# Patient Record
Sex: Female | Born: 1940 | Race: White | Hispanic: No | Marital: Single | Smoking: Former smoker
Health system: Southern US, Community
[De-identification: ages and names within clinical notes are randomized; demographics above are authoritative.]

## PROBLEM LIST (undated history)

## (undated) DIAGNOSIS — M1990 Unspecified osteoarthritis, unspecified site: Secondary | ICD-10-CM

## (undated) DIAGNOSIS — E162 Hypoglycemia, unspecified: Secondary | ICD-10-CM

## (undated) DIAGNOSIS — I959 Hypotension, unspecified: Secondary | ICD-10-CM

## (undated) HISTORY — DX: Hypotension, unspecified: I95.9

## (undated) HISTORY — PX: HEMORRHOID SURGERY: SHX153

## (undated) HISTORY — PX: OTHER SURGICAL HISTORY: SHX169

## (undated) HISTORY — DX: Unspecified osteoarthritis, unspecified site: M19.90

## (undated) HISTORY — DX: Hypoglycemia, unspecified: E16.2

---

## 2020-05-04 ENCOUNTER — Other Ambulatory Visit: Payer: Self-pay

## 2020-05-04 ENCOUNTER — Encounter (HOSPITAL_BASED_OUTPATIENT_CLINIC_OR_DEPARTMENT_OTHER): Payer: Self-pay

## 2020-05-04 ENCOUNTER — Emergency Department (HOSPITAL_BASED_OUTPATIENT_CLINIC_OR_DEPARTMENT_OTHER): Payer: Medicare Other

## 2020-05-04 ENCOUNTER — Emergency Department (HOSPITAL_BASED_OUTPATIENT_CLINIC_OR_DEPARTMENT_OTHER)
Admission: EM | Admit: 2020-05-04 | Discharge: 2020-05-04 | Disposition: A | Discharge status: Home / Self Care | Payer: Medicare Other | Attending: Emergency Medicine | Admitting: Emergency Medicine

## 2020-05-04 DIAGNOSIS — Y9289 Other specified places as the place of occurrence of the external cause: Secondary | ICD-10-CM | POA: Insufficient documentation

## 2020-05-04 DIAGNOSIS — W19XXXA Unspecified fall, initial encounter: Secondary | ICD-10-CM | POA: Diagnosis not present

## 2020-05-04 DIAGNOSIS — S8001XA Contusion of right knee, initial encounter: Secondary | ICD-10-CM | POA: Diagnosis not present

## 2020-05-04 DIAGNOSIS — Z87891 Personal history of nicotine dependence: Secondary | ICD-10-CM | POA: Diagnosis not present

## 2020-05-04 DIAGNOSIS — S0993XA Unspecified injury of face, initial encounter: Secondary | ICD-10-CM | POA: Diagnosis present

## 2020-05-04 DIAGNOSIS — Y9389 Activity, other specified: Secondary | ICD-10-CM | POA: Insufficient documentation

## 2020-05-04 DIAGNOSIS — Y999 Unspecified external cause status: Secondary | ICD-10-CM | POA: Diagnosis not present

## 2020-05-04 DIAGNOSIS — S5001XA Contusion of right elbow, initial encounter: Secondary | ICD-10-CM | POA: Insufficient documentation

## 2020-05-04 DIAGNOSIS — S0083XA Contusion of other part of head, initial encounter: Secondary | ICD-10-CM | POA: Insufficient documentation

## 2020-05-04 DIAGNOSIS — R55 Syncope and collapse: Secondary | ICD-10-CM

## 2020-05-04 DIAGNOSIS — G939 Disorder of brain, unspecified: Secondary | ICD-10-CM | POA: Insufficient documentation

## 2020-05-04 LAB — BASIC METABOLIC PANEL
Anion gap: 10 (ref 5–15)
BUN: 14 mg/dL (ref 8–23)
CO2: 24 mmol/L (ref 22–32)
Calcium: 9.7 mg/dL (ref 8.9–10.3)
Chloride: 103 mmol/L (ref 98–111)
Creatinine, Ser: 0.82 mg/dL (ref 0.44–1.00)
GFR calc Af Amer: >60 mL/min (ref >60)
GFR calc non Af Amer: >60 mL/min (ref >60)
Glucose, Bld: 139 mg/dL — ABNORMAL HIGH (ref 70–99)
Potassium: 3.6 mmol/L (ref 3.5–5.1)
Sodium: 137 mmol/L (ref 135–145)

## 2020-05-04 LAB — CBC
HCT: 40.2 % (ref 36.0–46.0)
Hemoglobin: 12.7 g/dL (ref 12.0–15.0)
MCH: 28.7 pg (ref 26.0–34.0)
MCHC: 31.6 g/dL (ref 30.0–36.0)
MCV: 90.7 fL (ref 80.0–100.0)
Platelets: 293 10*3/uL (ref 150–400)
RBC: 4.43 MIL/uL (ref 3.87–5.11)
RDW: 13.4 % (ref 11.5–15.5)
WBC: 9.8 10*3/uL (ref 4.0–10.5)
nRBC: 0 % (ref 0.0–0.2)

## 2020-05-04 LAB — CBG MONITORING, ED: Glucose-Capillary: 132 mg/dL — ABNORMAL HIGH (ref 70–99)

## 2020-05-04 MED ORDER — SODIUM CHLORIDE 0.9% FLUSH
3.0000 mL | Freq: Once | INTRAVENOUS | Status: DC
  Filled 2020-05-04: qty 3

## 2020-05-04 NOTE — Discharge Instructions (Addendum)
You were seen in the emergency department for evaluation of injuries after a fainting spell at your house yesterday.  You had a CAT scan of your head face along with x-rays of your right elbow and knee that did not show any serious findings.  The CAT scan of your head did not show a lesion that was noted in 2010 that was apparently larger now.  This will need follow-up with your doctors and likely an outpatient MRI.  Please return to the emergency department for any worsening or concerning symptoms.  You can use ice to the affected areas and Tylenol for pain.  Included below is the report of the CAT scan.  IMPRESSION:  1. No acute intracranial injury.  2. Hypodensity right temporal lobe has progressed since 2010. 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 Recommend elective  follow-up MRI of the brain without with contrast  3. Negative for facial fracture.

## 2020-05-04 NOTE — ED Notes (Signed)
Pt unable to urinate at this time.  

## 2020-05-04 NOTE — ED Triage Notes (Addendum)
Pt states she was reaching to turn off fan yesterday at 616am and passed out "woke up on the floor"-states she was out ~73min or less-pain to mouth,face-rigth UE and right LE-NAD-steady gait-pt states she was advised to go to walk in clinic yesterday-she did not seek medical attention

## 2020-05-04 NOTE — ED Provider Notes (Signed)
[facility] EMERGENCY DEPARTMENT Provider Note   CSN: [id number] Arrival date & time: 05/04/20  1130     History Chief Complaint  Patient presents with  . Loss of Consciousness    Barbara Wise is a 79 y.o. female.  She has a history of arthritis.  She said she woke up yesterday morning and reached over to adjust a fan and passed out striking her face right arm right leg on the ground.  It took her a while to be able to get up off the floor.  She thinks she was unconscious for about a minute.  Continues to have some elbow and knee pain.  Facial pain.  No blurry vision double vision.  No malocclusion.  No headache.  No numbness or weakness.  Not on blood thinners.  The history is provided by the patient.  Loss of Consciousness Episode history:  Single Most recent episode:  Yesterday Duration:  1 minute Progression:  Resolved Chronicity:  New Context: standing up   Witnessed: no   Relieved by:  Nothing Worsened by:  Nothing Ineffective treatments:  None tried Associated symptoms: no chest pain, no difficulty breathing, no fever, no focal weakness, no headaches, no nausea, no rectal bleeding, no shortness of breath, no visual change and no vomiting        Past Medical History:  Diagnosis Date  . Arthritis   . Hypotension   . Low blood sugar     There are no problems to display for this patient.   Past Surgical History:  Procedure Laterality Date  . hand surgey    . HEMORRHOID SURGERY       OB History   No obstetric history on file.     No family history on file.  Social History   Tobacco Use  . Smoking status: Former Games developer  . Smokeless tobacco: Never Used  Vaping Use  . Vaping Use: Never used  Substance Use Topics  . Alcohol use: Never  . Drug use: Never    Home Medications Prior to Admission medications   Not on File    Allergies    Sulfa antibiotics  Review of Systems   Review of Systems  Constitutional: Negative for fever.    HENT: Negative for sore throat.   Eyes: Negative for visual disturbance.  Respiratory: Negative for shortness of breath.   Cardiovascular: Positive for syncope. Negative for chest pain.  Gastrointestinal: Negative for abdominal pain, nausea and vomiting.  Genitourinary: Negative for dysuria.  Musculoskeletal: Negative for neck pain.  Skin: Positive for wound. Negative for rash.  Neurological: Positive for syncope. Negative for focal weakness and headaches.    Physical Exam Updated Vital Signs BP 123/73 (BP Location: Left Arm)   Pulse 71   Temp 99 F (37.2 C) (Oral)   Resp 18   Ht 5\' 3"  (1.6 m)   Wt 63 kg   SpO2 95%   BMI 24.62 kg/m   Physical Exam Vitals and nursing note reviewed.  Constitutional:      General: She is not in acute distress.    Appearance: She is well-developed.  HENT:     Head: Normocephalic.     Comments: Diffuse tenderness around her right orbit and zygoma.  Little bit of soreness of her mandible.  Swelling of lower lip. Eyes:     Conjunctiva/sclera: Conjunctivae normal.  Cardiovascular:     Rate and Rhythm: Normal rate and regular rhythm.     Pulses: Normal pulses.  Heart sounds: No murmur heard.   Pulmonary:     Effort: Pulmonary effort is normal. No respiratory distress.     Breath sounds: Normal breath sounds.  Abdominal:     Palpations: Abdomen is soft.     Tenderness: There is no abdominal tenderness.  Musculoskeletal:        General: Tenderness and signs of injury present. Normal range of motion.     Cervical back: Neck supple.     Comments: She has some bruising and abrasions over her right elbow.  Full range of motion.  She is also got some bruising and abrasions over her right knee.  Full range of motion no gross effusions.  Distal neurovascular intact.  Other extremities full range of motion without any pain or limitations.  No cervical thoracic or lumbar tenderness.  Skin:    General: Skin is warm and dry.     Capillary Refill:  Capillary refill takes less than 2 seconds.  Neurological:     General: No focal deficit present.     Mental Status: She is alert and oriented to person, place, and time.     Sensory: No sensory deficit.     Motor: No weakness.     ED Results / Procedures / Treatments   Labs (all labs ordered are listed, but only abnormal results are displayed) Labs Reviewed  BASIC METABOLIC PANEL - Abnormal; Notable for the following components:      Result Value   Glucose, Bld 139 (*)    All other components within normal limits  CBG MONITORING, ED - Abnormal; Notable for the following components:   Glucose-Capillary 132 (*)    All other components within normal limits  CBC  URINALYSIS, ROUTINE W REFLEX MICROSCOPIC    EKG EKG Interpretation  Date/Time:  Wednesday May 04 2020 11:54:33 EDT Ventricular Rate:  71 PR Interval:  128 QRS Duration: 78 QT Interval:  356 QTC Calculation: 386 R Axis:   44 Text Interpretation: Normal sinus rhythm Normal ECG No old tracing to compare Confirmed by Meridee ScoreButler, Janeah Kovacich [id number]) on 05/04/2020 12:06:56 PM   Radiology DG Elbow Complete Right  Result Date: 05/04/2020 CLINICAL DATA:  Right elbow pain after fall today. EXAM: RIGHT ELBOW - COMPLETE 3+ VIEW COMPARISON:  None. FINDINGS: There is no evidence of fracture, dislocation, or joint effusion. There is no evidence of arthropathy or other focal bone abnormality. Soft tissues are unremarkable. IMPRESSION: Negative. Electronically Signed   By: Lupita RaiderJames  Green Jr M.D.   On: 05/04/2020 13:55   CT Head Wo Contrast  Result Date: 05/04/2020 CLINICAL DATA:  Head trauma.  Fall EXAM: CT HEAD WITHOUT CONTRAST CT MAXILLOFACIAL WITHOUT CONTRAST TECHNIQUE: Multidetector CT imaging of the head and maxillofacial structures were performed using the standard protocol without intravenous contrast. Multiplanar CT image reconstructions of the maxillofacial structures were also generated. COMPARISON:  MRI head 11/20/2008. FINDINGS: CT  HEAD FINDINGS Brain: Mild ventricular enlargement consistent with atrophy. Hypodensity in the right temporal lobe lateral to the temporal horn measuring approximately 15 by 25 mm. This lesion has progressed since the prior MRI. No significant mass-effect. No associated hemorrhage. Negative for acute infarct, hemorrhage, mass. Vascular: Negative for hyperdense vessel Skull: Negative for fracture. Benign bony exostosis in the right temporal bone. Other: None CT MAXILLOFACIAL FINDINGS Osseous: Negative for facial fracture. Orbits: No orbital mass or edema.  Bilateral cataract extraction Sinuses: Paranasal sinuses clear.  Mastoid clear. Soft tissues: Negative for soft tissue swelling or mass. IMPRESSION: 1. No acute intracranial  injury. 2. Hypodensity right temporal lobe has progressed since 2010. 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 Recommend elective follow-up MRI of the brain without with contrast 3. Negative for facial fracture. Electronically Signed   By: Marlan Palau M.D.   On: 05/04/2020 13:54   DG Knee Complete 4 Views Right  Result Date: 05/04/2020 CLINICAL DATA:  Right knee pain after fall today. EXAM: RIGHT KNEE - COMPLETE 4+ VIEW COMPARISON:  None. FINDINGS: No evidence of fracture, dislocation, or joint effusion. No evidence of arthropathy or other focal bone abnormality. Soft tissues are unremarkable. IMPRESSION: Negative. Electronically Signed   By: Lupita Raider M.D.   On: 05/04/2020 13:56   CT Maxillofacial WO CM  Result Date: 05/04/2020 CLINICAL DATA:  Head trauma.  Fall EXAM: CT HEAD WITHOUT CONTRAST CT MAXILLOFACIAL WITHOUT CONTRAST TECHNIQUE: Multidetector CT imaging of the head and maxillofacial structures were performed using the standard protocol without intravenous contrast. Multiplanar CT image reconstructions of the maxillofacial structures were also generated. COMPARISON:  MRI head 11/20/2008. FINDINGS: CT HEAD FINDINGS Brain: Mild ventricular enlargement consistent  with atrophy. Hypodensity in the right temporal lobe lateral to the temporal horn measuring approximately 15 by 25 mm. This lesion has progressed since the prior MRI. No significant mass-effect. No associated hemorrhage. Negative for acute infarct, hemorrhage, mass. Vascular: Negative for hyperdense vessel Skull: Negative for fracture. Benign bony exostosis in the right temporal bone. Other: None CT MAXILLOFACIAL FINDINGS Osseous: Negative for facial fracture. Orbits: No orbital mass or edema.  Bilateral cataract extraction Sinuses: Paranasal sinuses clear.  Mastoid clear. Soft tissues: Negative for soft tissue swelling or mass. IMPRESSION: 1. No acute intracranial injury. 2. Hypodensity right temporal lobe has progressed since 2010. 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 Recommend elective follow-up MRI of the brain without with contrast 3. Negative for facial fracture. Electronically Signed   By: Marlan Palau M.D.   On: 05/04/2020 13:54    Procedures Procedures (including critical care time)  Medications Ordered in ED Medications  sodium chloride flush (NS) 0.9 % injection 3 mL (has no administration in time range)    ED Course  I have reviewed the triage vital signs and the nursing notes.  Pertinent labs & imaging results that were available during my care of the patient were reviewed by me and considered in my medical decision making (see chart for details).  Clinical Course as of May 05 1923  Wed May 04, 2020  1424 Patient is aware of the lesion that was noted in the past.  She is not excited about the prospect of getting an MRI.  I told her she does not need it as an emergency but would have her follow-up with her neurologist and primary care doctor regarding this.   [MB]    Clinical Course User Index [MB] Terrilee Files, MD   MDM Rules/Calculators/A&P                         This patient complains of syncope, facial pain, right elbow and right knee pain; this involves  an extensive number of treatment Options and is a complaint that carries with it a high risk of complications and Morbidity. The differential includes arrhythmia, anemia, metabolic derangement, head bleed, facial fractures, dislocation, fracture, contusion  I ordered, reviewed and interpreted labs, which included CBC with normal white count normal hemoglobin, chemistries normal except for mildly elevated glucose  I ordered imaging studies which included CT head and  maxface, x-rays of right elbow and right knee and I independently    visualized and interpreted imaging which showed no acute findings.  Radiology does comment upon a hypodensity that will need follow-up Previous records obtained and reviewed in epic  After the interventions stated above, I reevaluated the patient and found patient's vitals and symptoms to be stable.  Reviewed imaging findings with patient and recommended follow-up with her PCP.  Return instructions discussed.   Final Clinical Impression(s) / ED Diagnoses Final diagnoses:  Syncope and collapse  Contusion of face, initial encounter  Contusion of right elbow, initial encounter  Contusion of right knee, initial encounter  Brain lesion    Rx / DC Orders ED Discharge Orders    None       Terrilee Files, MD 05/04/20 Kristopher Oppenheim

## 2021-05-04 IMAGING — CT CT HEAD W/O CM
3 series · 15 of 47 positions shown, 18 images · non-contrast
Comparison: MRI head 11/20/2008.

CLINICAL DATA: Head trauma.  Fall

EXAM:
CT HEAD WITHOUT CONTRAST
CT MAXILLOFACIAL WITHOUT CONTRAST
TECHNIQUE: Multidetector CT imaging of the head and maxillofacial structures
were performed using the standard protocol without intravenous
contrast. Multiplanar CT image reconstructions of the maxillofacial
structures were also generated.

[Series 2: head wo · axial · 0.42mm/px · z∈[-151,-26]mm · 9 of 31 slices shown, 12 images]
[im 3/31  brain]
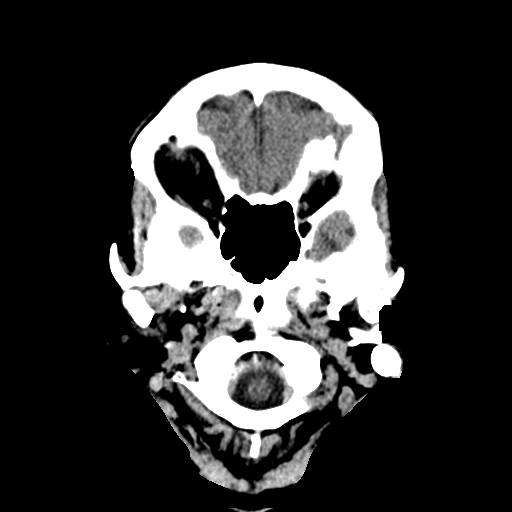
[im 3/31  bone]
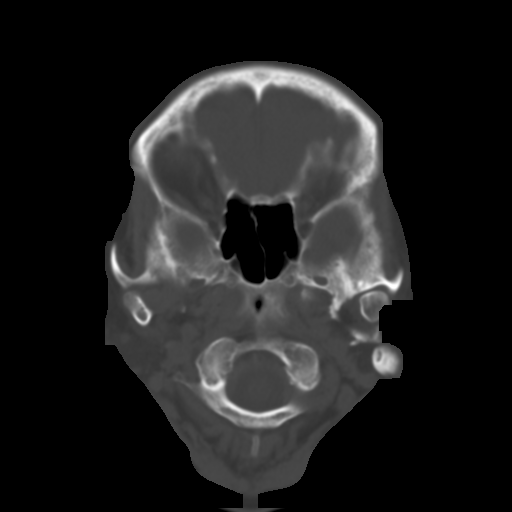
[im 6/31  brain]
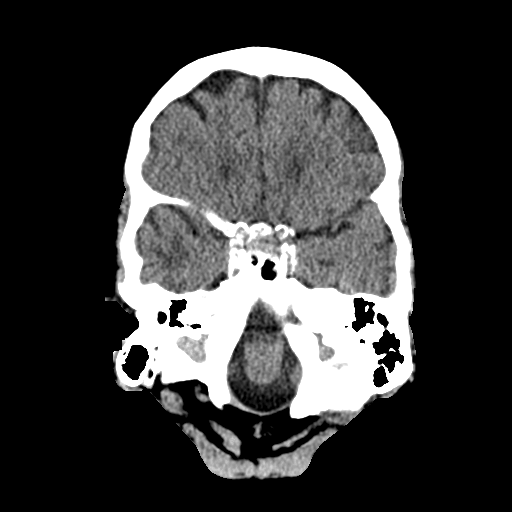
[im 9/31  brain]
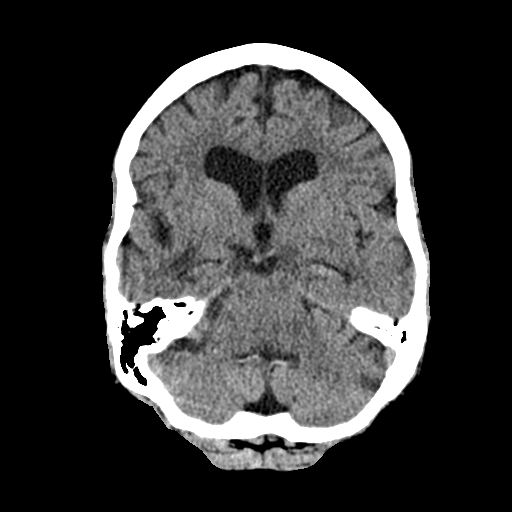
[im 12/31  brain]
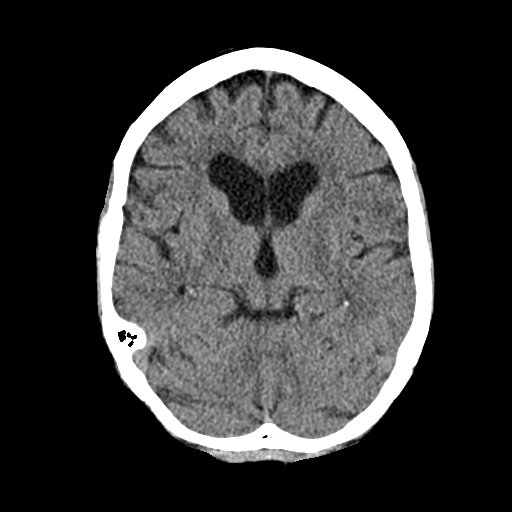
[im 16/31  brain]
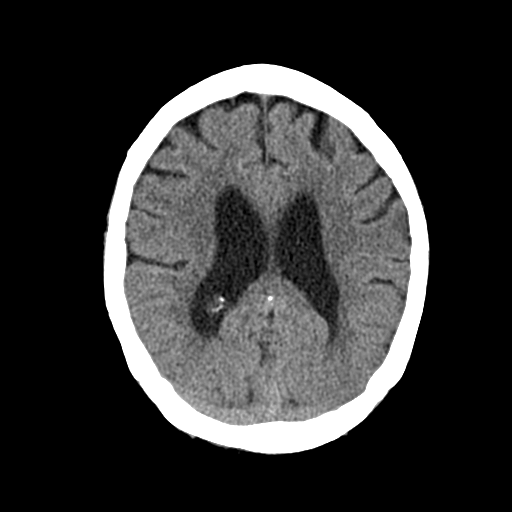
[im 16/31  bone]
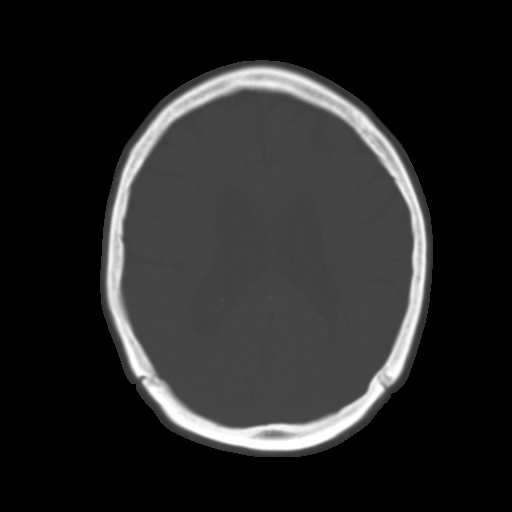
[im 19/31  brain]
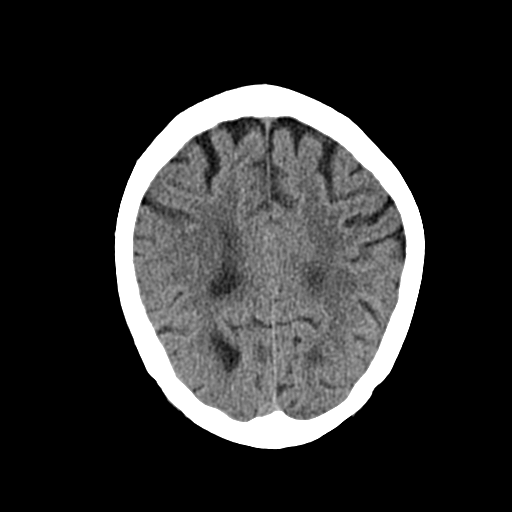
[im 22/31  brain]
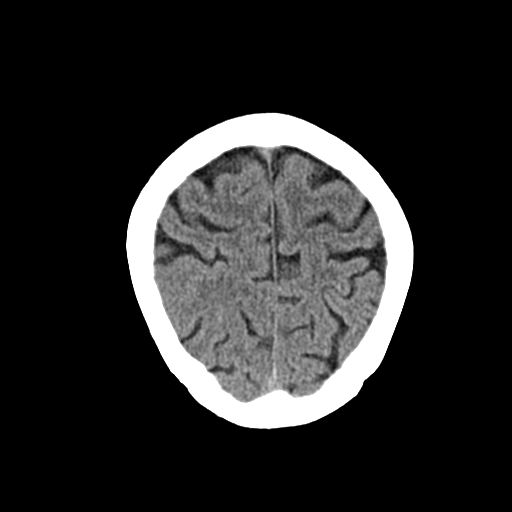
[im 25/31  brain]
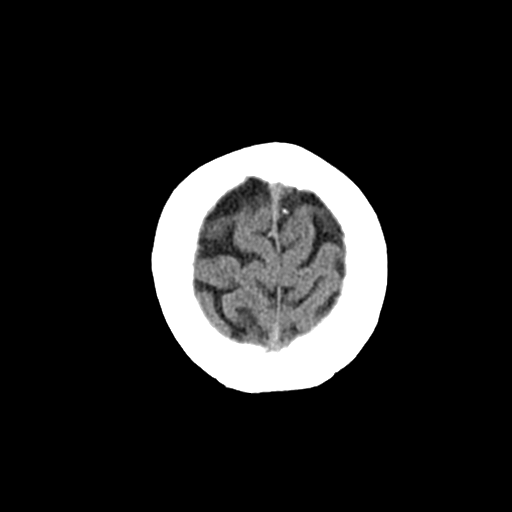
[im 28/31  brain]
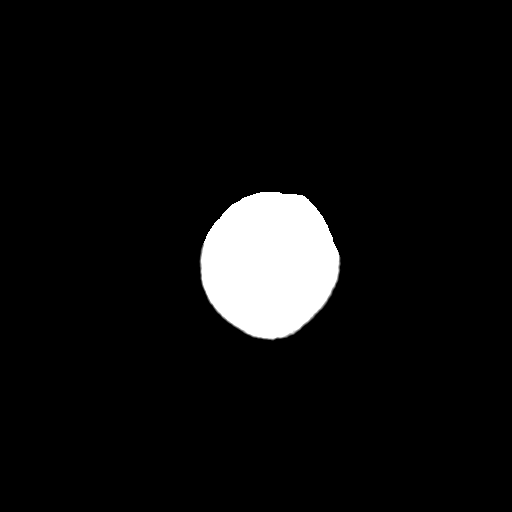
[im 28/31  bone]
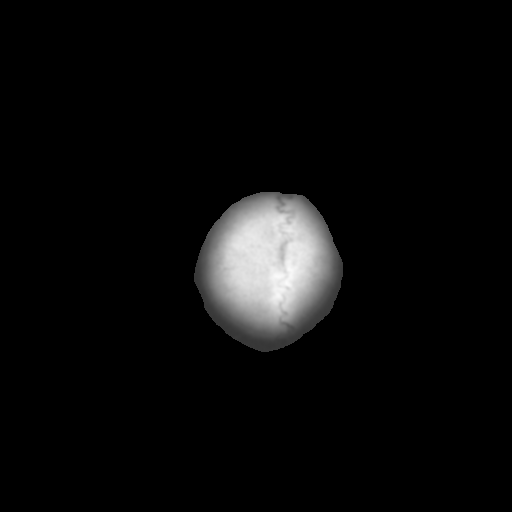

[Series 4: cor head wo · coronal · 0.30mm/px · 3 of 62 slices shown]
[im 21/62  brain]
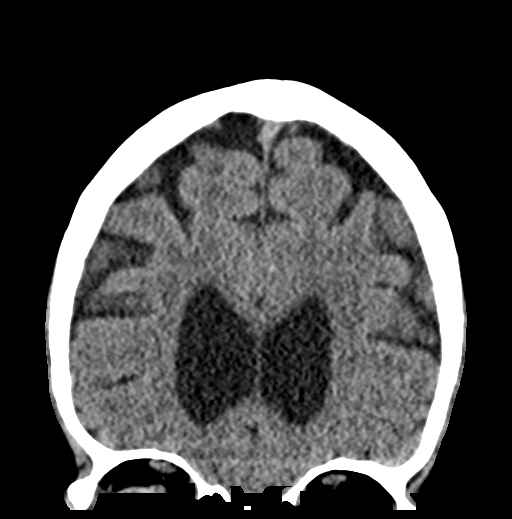
[im 28/62  brain]
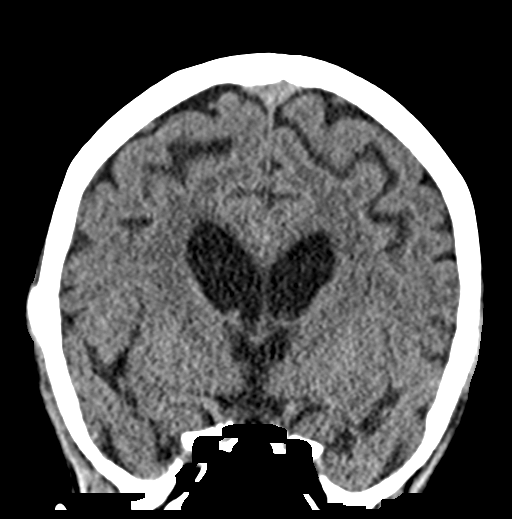
[im 34/62  brain]
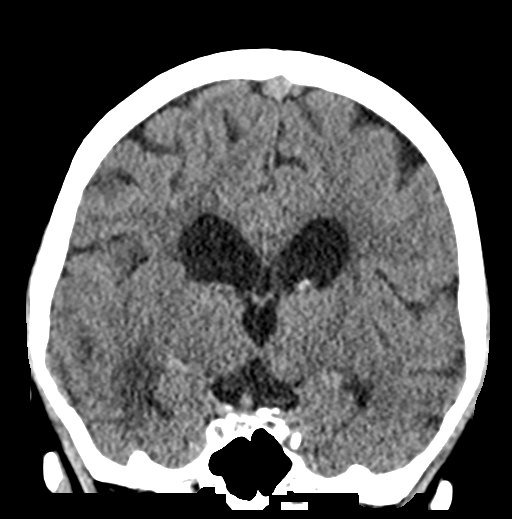

[Series 5: sag head wo · sagittal · 0.31mm/px · 3 of 50 slices shown]
[im 17/50  brain]
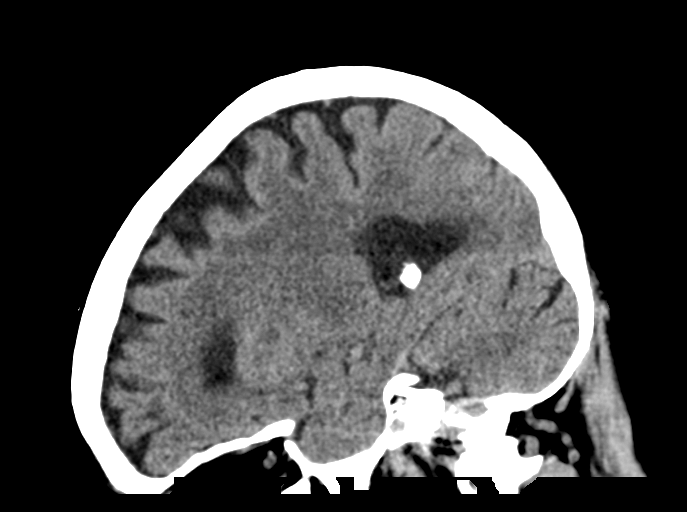
[im 25/50  brain]
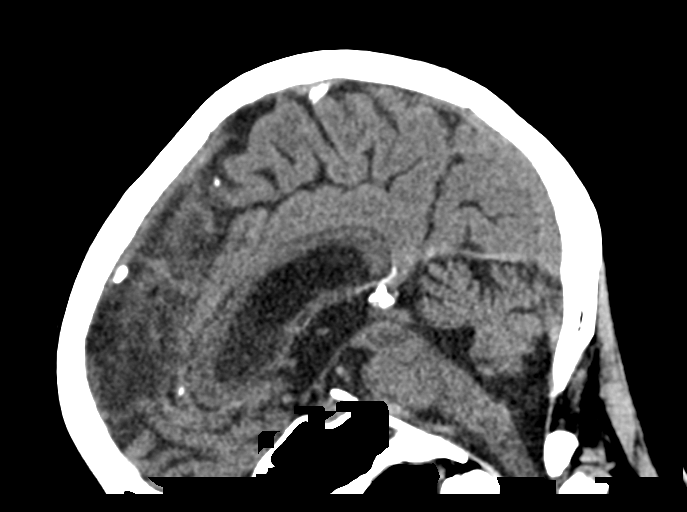
[im 33/50  brain]
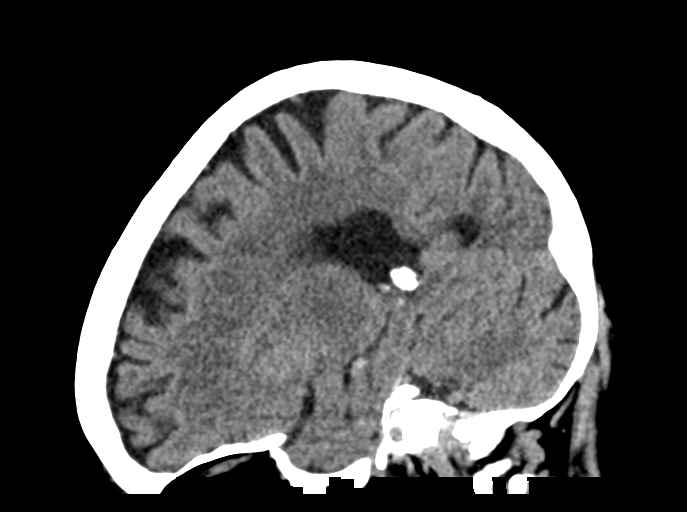

[15 of 47 positions shown; findings below may reference images not displayed]

FINDINGS: CT HEAD FINDINGS

Brain: Mild ventricular enlargement consistent with atrophy.
Hypodensity in the right temporal lobe lateral to the temporal horn
measuring approximately 15 by 25 mm. This lesion has progressed
since the prior MRI. No significant mass-effect. No associated
hemorrhage.

Negative for acute infarct, hemorrhage, mass.

Vascular: Negative for hyperdense vessel

Skull: Negative for fracture. Benign bony exostosis in the right
temporal bone.

Other: None

CT MAXILLOFACIAL FINDINGS

Osseous: Negative for facial fracture.

Orbits: No orbital mass or edema.  Bilateral cataract extraction

Sinuses: Paranasal sinuses clear.  Mastoid clear.

Soft tissues: Negative for soft tissue swelling or mass.
IMPRESSION: 1. No acute intracranial injury.
2. Hypodensity right temporal lobe has progressed since 9222. This
could represent a low-grade glial neoplasm. Recommend elective
follow-up MRI of the brain without with contrast
3. Negative for facial fracture.

## 2021-05-04 IMAGING — CR DG KNEE COMPLETE 4+V*R*
4 series · 4 of 4 positions shown · non-contrast
Comparison: None.

CLINICAL DATA: Right knee pain after fall today.

EXAM:
RIGHT KNEE - COMPLETE 4+ VIEW

[t knee ap right]
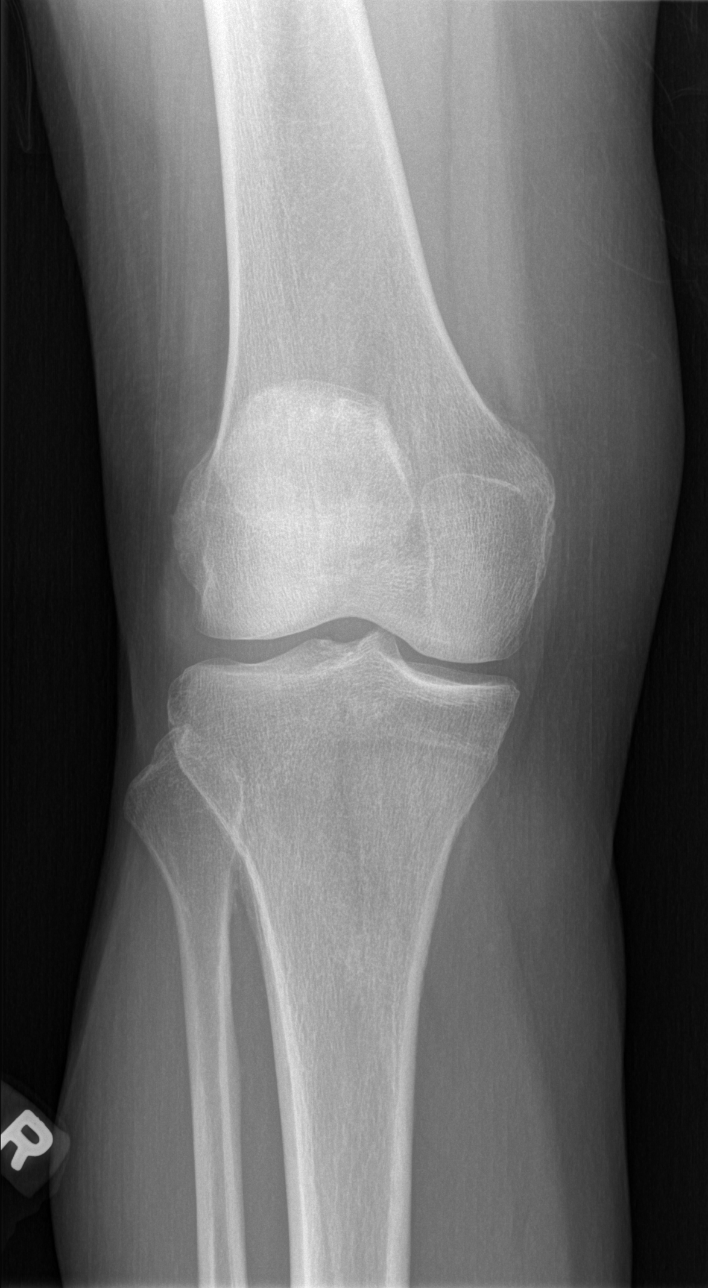

[t knee oblique right (1 of 2)]
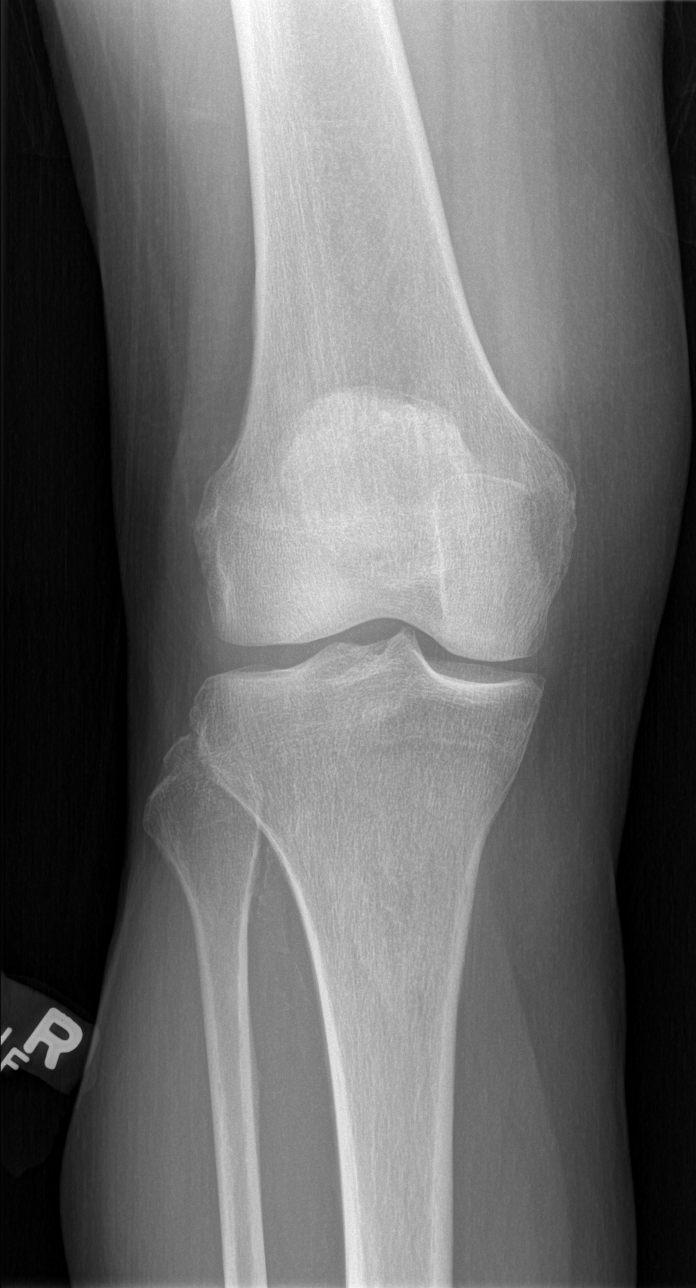

[t knee oblique right (2 of 2)]
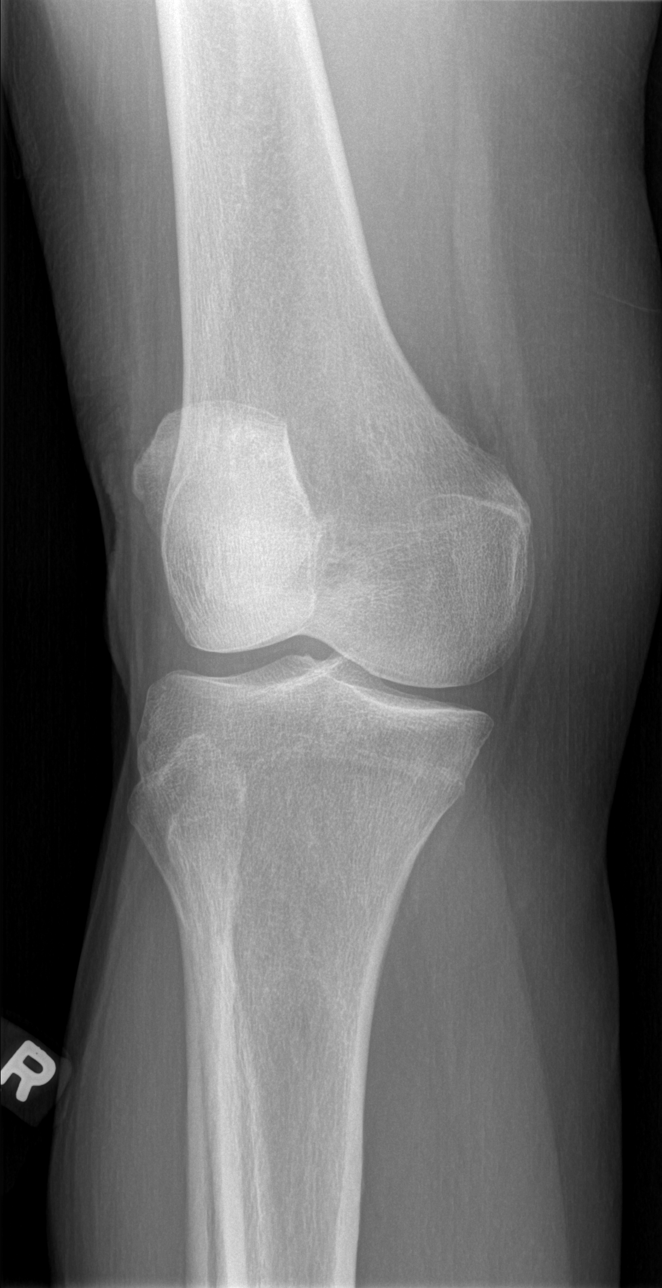

[t knee lat right]
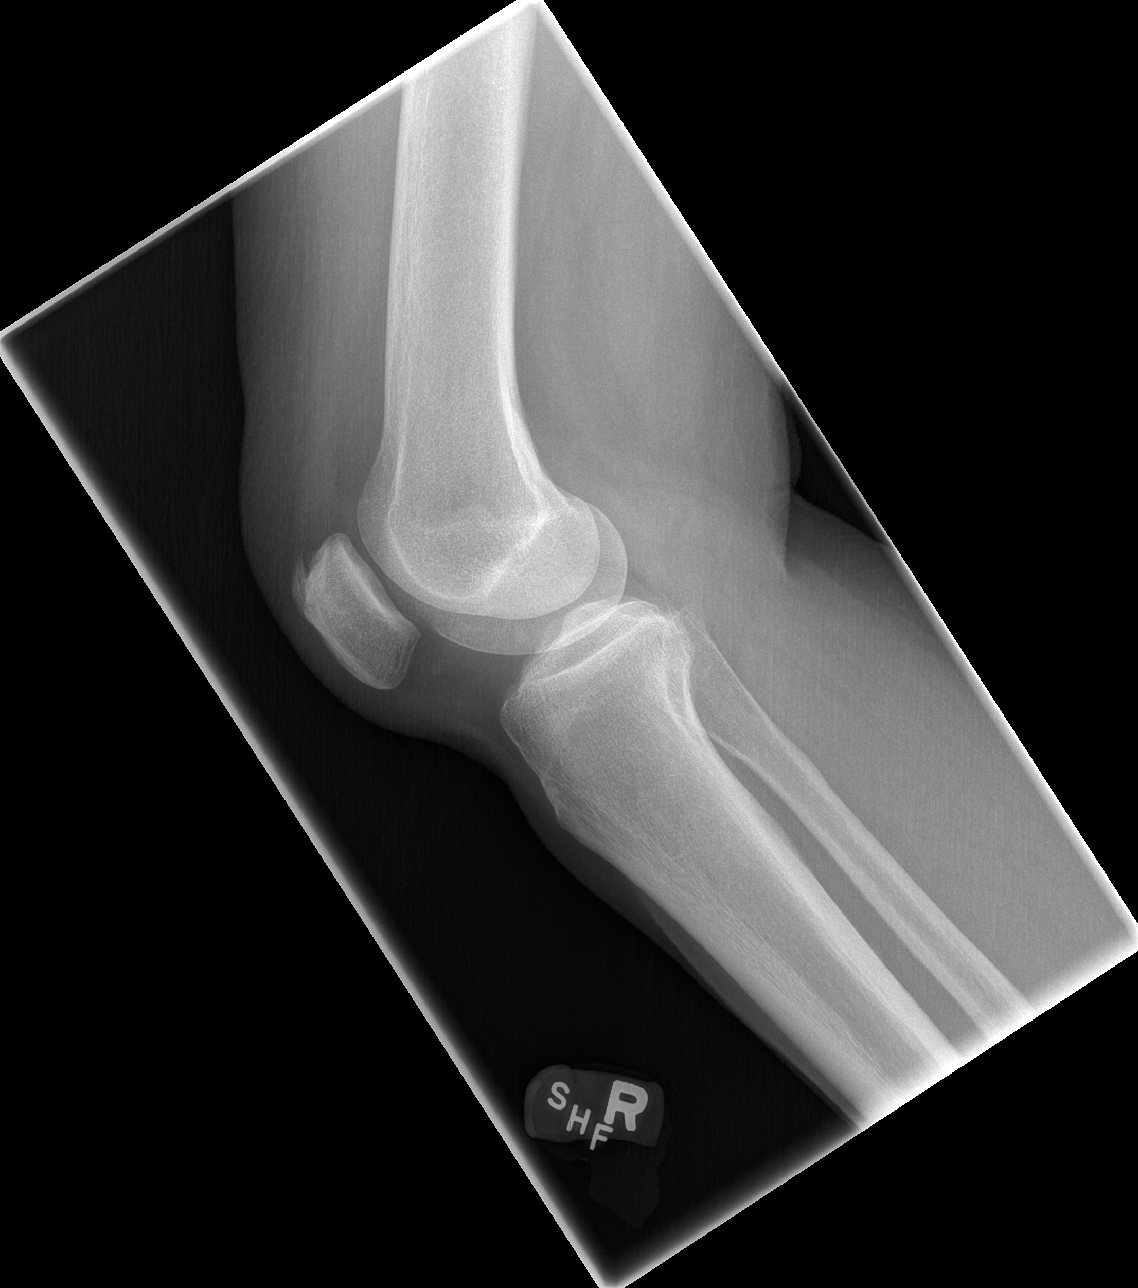

[4 of 4 positions shown; findings below may reference images not displayed]

FINDINGS: No evidence of fracture, dislocation, or joint effusion. No evidence
of arthropathy or other focal bone abnormality. Soft tissues are
unremarkable.
IMPRESSION: Negative.
